# Patient Record
Sex: Female | Born: 1955 | Race: White | Hispanic: No | Smoking: Never smoker
Health system: Southern US, Community
[De-identification: ages and names within clinical notes are randomized; demographics above are authoritative.]

## PROBLEM LIST (undated history)

## (undated) DIAGNOSIS — I639 Cerebral infarction, unspecified: Secondary | ICD-10-CM

## (undated) DIAGNOSIS — F32A Depression, unspecified: Secondary | ICD-10-CM

## (undated) DIAGNOSIS — R4189 Other symptoms and signs involving cognitive functions and awareness: Secondary | ICD-10-CM

## (undated) DIAGNOSIS — F329 Major depressive disorder, single episode, unspecified: Secondary | ICD-10-CM

## (undated) DIAGNOSIS — I10 Essential (primary) hypertension: Secondary | ICD-10-CM

## (undated) DIAGNOSIS — R413 Other amnesia: Secondary | ICD-10-CM

## (undated) HISTORY — DX: Cerebral infarction, unspecified: I63.9

## (undated) HISTORY — DX: Essential (primary) hypertension: I10

## (undated) HISTORY — DX: Other amnesia: R41.3

## (undated) HISTORY — DX: Major depressive disorder, single episode, unspecified: F32.9

## (undated) HISTORY — DX: Depression, unspecified: F32.A

## (undated) HISTORY — PX: BREAST EXCISIONAL BIOPSY: SUR124

## (undated) HISTORY — DX: Other symptoms and signs involving cognitive functions and awareness: R41.89

---

## 2004-08-31 ENCOUNTER — Ambulatory Visit: Payer: Self-pay | Admitting: Internal Medicine

## 2004-12-06 ENCOUNTER — Ambulatory Visit: Payer: Self-pay | Admitting: Internal Medicine

## 2005-06-06 ENCOUNTER — Ambulatory Visit: Payer: Self-pay | Admitting: Specialist

## 2005-07-28 ENCOUNTER — Ambulatory Visit: Payer: Self-pay | Admitting: General Surgery

## 2006-08-17 ENCOUNTER — Ambulatory Visit: Payer: Self-pay | Admitting: General Surgery

## 2007-02-05 ENCOUNTER — Emergency Department: Payer: Self-pay | Admitting: Emergency Medicine

## 2007-02-05 ENCOUNTER — Other Ambulatory Visit: Payer: Self-pay

## 2007-05-01 ENCOUNTER — Emergency Department: Payer: Self-pay | Admitting: Emergency Medicine

## 2007-07-04 ENCOUNTER — Ambulatory Visit: Payer: Self-pay | Admitting: Obstetrics and Gynecology

## 2007-08-04 ENCOUNTER — Ambulatory Visit: Payer: Self-pay | Admitting: Gastroenterology

## 2007-08-29 ENCOUNTER — Ambulatory Visit: Payer: Self-pay | Admitting: Gastroenterology

## 2007-08-29 ENCOUNTER — Ambulatory Visit: Payer: Self-pay | Admitting: Unknown Physician Specialty

## 2007-09-06 ENCOUNTER — Ambulatory Visit: Payer: Self-pay | Admitting: General Surgery

## 2011-05-20 ENCOUNTER — Ambulatory Visit: Payer: Self-pay | Admitting: Family Medicine

## 2011-06-17 ENCOUNTER — Inpatient Hospital Stay: Payer: Self-pay | Admitting: Student

## 2011-06-19 DIAGNOSIS — I6789 Other cerebrovascular disease: Secondary | ICD-10-CM

## 2015-05-13 ENCOUNTER — Ambulatory Visit: Payer: PRIVATE HEALTH INSURANCE | Attending: Neurology | Admitting: Speech Pathology

## 2015-05-13 DIAGNOSIS — R41841 Cognitive communication deficit: Secondary | ICD-10-CM | POA: Diagnosis not present

## 2015-05-13 DIAGNOSIS — R413 Other amnesia: Secondary | ICD-10-CM | POA: Insufficient documentation

## 2015-05-14 ENCOUNTER — Encounter: Payer: Self-pay | Admitting: Speech Pathology

## 2015-05-14 NOTE — Therapy (Signed)
[place] [facility] MAIN [facility] SERVICES [address] [place], [place], [address] Phone: [phone]   Fax:  [phone]  Speech Language Pathology Evaluation  Patient Details  Name: Victoria Myers MRN: [id number] Date of Birth: [birth year] Referring Provider:  Morene Crocker, MD  Encounter Date: 05/13/2015      End of Session - 05/14/15 1151    Visit Number 1   Number of Visits 9   Date for SLP Re-Evaluation 06/17/15   SLP Start Time 0900   SLP Stop Time  0957   SLP Time Calculation (min) 57 min   Activity Tolerance Patient tolerated treatment well      Past Medical History  Diagnosis Date  . Stroke   . Depression   . Memory loss     Per MD note  . Hypertension   . Pseudodementia     Per MD note    History reviewed. No pertinent past surgical history.  There were no vitals filed for this visit.  Visit Diagnosis: Cognitive communication deficit - Plan: SLP plan of care cert/re-cert      Subjective Assessment - 05/14/15 1143    Subjective The patient and her daughter describe short term memory loss.  The patient states that she is able to keep up with her work responsibilities by using a system.  Her daughter reports that the patient is "forgetfull and tells the same story over and over"   Currently in Pain? No/denies            SLP Evaluation [facility] - 05/14/15 1143    SLP Visit Information   SLP Received On 05/13/15   Onset Date 04/27/2015   Medical Diagnosis Memory loss   Subjective   Patient/Family Stated Goal Maintain independence   Prior Functional Status   Cognitive/Linguistic Baseline Baseline deficits   Baseline deficit details memory impairments, pseudodementia   Cognition   Overall Cognitive Status History of cognitive impairments - at baseline   Standardized Assessments   Standardized Assessments  Montreal Cognitive Assessment (MOCA);Cognitive Linguistic Quick Test      The Cognitive Linguistic Quick Test  (CLQT) was administered to assess the relative status of five cognitive domains: attention, memory, language, executive functioning, and visuospatial skills. Scores from 10 tasks were used to estimate severity ratings (for age groups 18-69 years and 70-89 years) for each domain, a clock drawing task, as well as an overall composite severity rating of cognition.    Task    Score Personal Facts  8/8 Symbol Cancellation  12/12 Confrontation Naming  10/10 Clock Drawing  11/13 Story Retelling   4/10 Symbol Trails    9/10 Generative Naming  8/9 Design Memory  4/6 Mazes    4/8 Design Generation   8/13  Cognitive Domain  Severity Rating Attention   Mild  Memory   Moderate Executive Function  WNL Language   WNL Visuospatial Skills  Mild Clock Drawing  Mild  Composite Severity Rating Mild     Montreal Cognitive Assessment (MOCA)  Version: 7.2  Visuospatieal/Executive Alternating trail making       1/1 Visuoconstruction Skills (copy 3-d design) 1/1 Draw a clock     1/3 Naming     3/3 Attention Forward digit span    1/1 Backward digit span    1/1 Vigilance     1/1 Serial 7's     3/3 Language  Verbal Fluency    1/1 Repetition     1/2 Abstraction     1/2  Delayed Recall    0/5 Orientation     3/6  TOTAL     18/30            SLP Education - 05/14/15 1150    Education provided Yes   Education Details Discuss the nature of speech therapy for memory loss- approach will be restorative and compensatory treatment   Person(s) Educated Patient;Child(ren)   Methods Explanation   Comprehension Verbalized understanding            SLP Long Term Goals - 05/14/15 1216    SLP LONG TERM GOAL #1   Title Patient will demonstrate functional cognitive-communication skills for independent completion of personal responsibilities.   Time 4   Period Weeks   Status New   SLP LONG TERM GOAL #2   Title Patient will complete memory strategy activities with 80% accuracy.   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  The results of the Cognitive Linguistic Quick Test (CLQT) indicate a composite severity rating of mild.  The patient demonstrates moderate deficits in the domain of memory, mild deficits in the domains of attention, visuospatial skills, and clock drawing.  The patient demonstrated WNL function in the domains of language and executive skills.  The patient demonstrates relative strength in language function and executive skills.     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  She does not have or implement a similar system for her personal life.  The patient would benefit from skilled speech therapy for restorative and compensatory treatment of memory and cognitive-communication deficits.  In view of her successful use of an external memory system with her work, it is anticipated that she can achieve equal success with her person life.   Speech Therapy Frequency 2x / week   Duration 4 weeks   Treatment/Interventions Internal/external aids;Cognitive reorganization;Functional tasks;SLP instruction and feedback;Compensatory strategies;Patient/family education   Potential to Achieve Goals Good   Potential Considerations Ability to learn/carryover information;Co-morbidities;Cooperation/participation level;Previous level of function;Severity of impairments;Family/community support   SLP Home Exercise Plan To be developed   Consulted and Agree with Plan of Care Patient;Family member/caregiver   Family Member Consulted Daughter        Problem List There are no active problems to display for this patient.   Leandrew Koyanagi 05/14/2015, 12:23 PM  [place] [facility] MAIN [facility] SERVICES [address] [place], [place], [address] Phone: [phone]   Fax:  [phone]

## 2015-05-19 ENCOUNTER — Encounter: Payer: PRIVATE HEALTH INSURANCE | Admitting: Speech Pathology

## 2015-05-21 ENCOUNTER — Ambulatory Visit: Payer: PRIVATE HEALTH INSURANCE | Attending: Neurology | Admitting: Speech Pathology

## 2015-05-21 DIAGNOSIS — R41841 Cognitive communication deficit: Secondary | ICD-10-CM | POA: Diagnosis not present

## 2015-05-22 ENCOUNTER — Encounter: Payer: Self-pay | Admitting: Speech Pathology

## 2015-05-22 NOTE — Therapy (Signed)
[facility] MAIN [facility] SERVICES [address] [place], [place], [address] Phone: [phone]   Fax:  [phone]  Speech Language Pathology Treatment  Patient Details  Name: Victoria Myers MRN: [id number] Date of Birth: 1956/09/26 Referring Provider:  Morene Crocker, MD  Encounter Date: 05/21/2015      End of Session - 05/22/15 1407    Visit Number 2   Number of Visits 9   Date for SLP Re-Evaluation 06/17/15   SLP Start Time 0900   SLP Stop Time  0948   SLP Time Calculation (min) 48 min   Activity Tolerance Patient tolerated treatment well      Past Medical History  Diagnosis Date  . Stroke   . Depression   . Memory loss     Per MD note  . Hypertension   . Pseudodementia     Per MD note    History reviewed. No pertinent past surgical history.  There were no vitals filed for this visit.  Visit Diagnosis: Cognitive communication deficit      Subjective Assessment - 05/22/15 1406    Subjective Patient states that she feels more comfortable than she did at the initial evaluation.   Patient is accompained by: Family member   Currently in Pain? No/denies               ADULT SLP TREATMENT - 05/22/15 0001    General Information   Behavior/Cognition Alert;Cooperative;Pleasant mood;Requires cueing   Treatment Provided   Treatment provided Cognitive-Linquistic   Pain Assessment   Pain Assessment No/denies pain   Cognitive-Linquistic Treatment   Treatment focused on Cognition   Skilled Treatment The patient is accompanied by her daughter, who is providing the cognitive assistance that the patient requires.  The patient brought her work Visual merchandiser.  This is working quite well per patient "I never forget anything for my clients!"  The three of [place] discussed implemented a second system to cover everything except work.  We reviewed/discussed a sample "memory book" system.  The patient's daughter generated a plan  for the type of system to purchase and will help the patient get the system organized. The patient was given written memory strategies.  We discussed the need for the patient to begin to schedule activities that will get her re-engaged in her life.   Assessment / Recommendations / Plan   Plan Continue with current plan of care   Progression Toward Goals   Progression toward goals Progressing toward goals          SLP Education - 05/22/15 1407    Education provided Yes   Education Details Memor strategies, effective use of external memory aids   Person(s) Educated Patient;Child(ren)   Methods Explanation;Handout   Comprehension Verbalized understanding            SLP Long Term Goals - 05/14/15 1216    SLP LONG TERM GOAL #1   Title Patient will demonstrate functional cognitive-communication skills for independent completion of personal responsibilities.   Time 4   Period Weeks   Status New   SLP LONG TERM GOAL #2   Title Patient will complete memory strategy activities with 80% accuracy.   Time 4   Period Weeks   Status New          Plan - 05/22/15 1408    Clinical Impression Statement The patient demonstrates good potential to effectively use an organizational system that will encourage her to be independent in meeting  her personal responsibilities as well as re-engaging in her life.   Speech Therapy Frequency 2x / week   Duration 4 weeks   Treatment/Interventions Internal/external aids;Cognitive reorganization;Functional tasks;SLP instruction and feedback;Compensatory strategies;Patient/family education   Potential to Achieve Goals Good   Potential Considerations Ability to learn/carryover information;Co-morbidities;Cooperation/participation level;Previous level of function;Severity of impairments;Family/community support   SLP Home Exercise Plan The patient and her daughter will purchase system and begin implementation   Consulted and Agree with Plan of Care  Patient;Family member/caregiver   Family Member Consulted Daughter        Problem List There are no active problems to display for this patient.  Dollene Primrose, MS/CCC- SLP  Leandrew Koyanagi 05/22/2015, 2:10 PM  [place] [facility] MAIN [facility] SERVICES [address] [place], [place], [address] Phone: [phone]   Fax:  [phone]

## 2015-05-26 ENCOUNTER — Ambulatory Visit: Payer: PRIVATE HEALTH INSURANCE | Admitting: Speech Pathology

## 2015-05-26 DIAGNOSIS — R41841 Cognitive communication deficit: Secondary | ICD-10-CM

## 2015-05-27 ENCOUNTER — Encounter: Payer: Self-pay | Admitting: Speech Pathology

## 2015-05-27 NOTE — Therapy (Signed)
[place] [facility] MAIN [facility] SERVICES [address] [place], [place], [address] Phone: [phone]   Fax:  [phone]  Speech Language Pathology Treatment  Patient Details  Name: Victoria Myers MRN: [id number] Date of Birth: Apr 09, 1956 Referring Provider:  Morene Crocker, MD  Encounter Date: 05/26/2015      End of Session - 05/27/15 0937    Visit Number 3   Number of Visits 9   Date for SLP Re-Evaluation 06/17/15   SLP Start Time 1430   SLP Stop Time  1500   SLP Time Calculation (min) 30 min   Activity Tolerance Patient tolerated treatment well      Past Medical History  Diagnosis Date  . Stroke   . Depression   . Memory loss     Per MD note  . Hypertension   . Pseudodementia     Per MD note    History reviewed. No pertinent past surgical history.  There were no vitals filed for this visit.  Visit Diagnosis: Cognitive communication deficit      Subjective Assessment - 05/27/15 0936    Subjective Patient reports that she is feeling very positive about using the organization system to improve her independence and help her re-engage in her life.   Patient is accompained by: Family member   Currently in Pain? No/denies          ADULT SLP TREATMENT - 05/27/15 0001    General Information   Behavior/Cognition Alert;Cooperative;Pleasant mood;Requires cueing   Treatment Provided   Treatment provided Cognitive-Linquistic   Pain Assessment   Pain Assessment No/denies pain   Cognitive-Linquistic Treatment   Treatment focused on Cognition   Skilled Treatment The patient is accompanied by her daughter, who is providing the cognitive assistance that the patient requires.  The patient bought a new work Visual merchandiser.  Her daughter is helping her get the system set up and develop the protocols to keep it going.  The system has a "note" section on the front and monthly and weekly calendars.  The patient was guided in using  the note section to write herself reminders to make calls and schedule items.    Assessment / Recommendations / Plan   Plan Continue with current plan of care   Progression Toward Goals   Progression toward goals Progressing toward goals          SLP Education - 05/27/15 0937    Education provided Yes   Education Details Effective use of external memory aids   Person(s) Educated Patient;Child(ren)   Methods Explanation   Comprehension Verbalized understanding;Returned demonstration;Need further instruction           SLP Long Term Goals - 05/14/15 1216    SLP LONG TERM GOAL #1   Title Patient will demonstrate functional cognitive-communication skills for independent completion of personal responsibilities.   Time 4   Period Weeks   Status New   SLP LONG TERM GOAL #2   Title Patient will complete memory strategy activities with 80% accuracy.   Time 4   Period Weeks   Status New          Plan - 05/27/15 2725    Clinical Impression Statement The patient demonstrates good potential to effectively use an organizational system that will encourage her to be independent in meeting her personal responsibilities as well as re-engaging in her life.   Speech Therapy Frequency 2x / week   Duration 4 weeks   Treatment/Interventions Internal/external aids;Cognitive  reorganization;Functional tasks;SLP instruction and feedback;Compensatory strategies;Patient/family education   Potential to Achieve Goals Good   Potential Considerations Ability to learn/carryover information;Co-morbidities;Cooperation/participation level;Previous level of function;Severity of impairments;Family/community support   SLP Home Exercise Plan The patient and her daughter are to continue implementing organization system   Consulted and Agree with Plan of Care Patient;Family member/caregiver   Family Member Consulted Daughter        Problem List There are no active problems to display for this  patient.  Dollene Primrose, MS/CCC- SLP  Leandrew Koyanagi 05/27/2015, 9:40 AM  [place] [facility] MAIN [facility] SERVICES [address] [place], [place], [address] Phone: [phone]   Fax:  [phone]

## 2015-05-28 ENCOUNTER — Ambulatory Visit: Payer: PRIVATE HEALTH INSURANCE | Admitting: Speech Pathology

## 2015-06-02 ENCOUNTER — Ambulatory Visit: Payer: PRIVATE HEALTH INSURANCE | Admitting: Speech Pathology

## 2015-06-02 DIAGNOSIS — R41841 Cognitive communication deficit: Secondary | ICD-10-CM | POA: Diagnosis not present

## 2015-06-03 ENCOUNTER — Encounter: Payer: Self-pay | Admitting: Speech Pathology

## 2015-06-03 NOTE — Therapy (Signed)
[place] [facility] MAIN [facility] SERVICES [address] [place], [place], [address] Phone: [phone]   Fax:  [phone]  Speech Language Pathology Treatment  Patient Details  Name: Victoria Myers MRN: [id number] Date of Birth: April 17, 1956 Referring Provider:  Morene Crocker, MD  Encounter Date: 06/02/2015      End of Session - 06/03/15 1405    Visit Number 4   Number of Visits 9   Date for SLP Re-Evaluation 06/17/15   SLP Start Time 0900   SLP Stop Time  0947   SLP Time Calculation (min) 47 min   Activity Tolerance Patient tolerated treatment well      Past Medical History  Diagnosis Date  . Stroke   . Depression   . Memory loss     Per MD note  . Hypertension   . Pseudodementia     Per MD note    History reviewed. No pertinent past surgical history.  There were no vitals filed for this visit.  Visit Diagnosis: Cognitive communication deficit      Subjective Assessment - 06/03/15 1403    Subjective "I feel good"  Patient reports that she is feeling perkier.   Currently in Pain? No/denies               ADULT SLP TREATMENT - 06/03/15 0001    General Information   Behavior/Cognition Alert;Cooperative;Pleasant mood;Requires cueing   Treatment Provided   Treatment provided Cognitive-Linquistic   Pain Assessment   Pain Assessment No/denies pain   Cognitive-Linquistic Treatment   Treatment focused on Cognition   Skilled Treatment The patient came to therapy without her daughter, who is providing the cognitive assistance that the patient.  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  The patient reports that she is using the new system.  The patient was guided in using the note section to write herself reminder to find her speech therapy schedule and write her schedule in her system.  She was guided in writing in her next speech therapy appointment.  The patient reports that she discovered that she had not  re-filled a medication (Aricept) and so had not been taking it "for a while".  She feels that resuming this medication has been efficacious and has led to her returning to many activities including going to church, going out to eat with a friend, cooking, doing things around the house and yard, and visiting with her aunt.    Assessment / Recommendations / Plan   Plan Continue with current plan of care   Progression Toward Goals   Progression toward goals Progressing toward goals          SLP Education - 06/03/15 1404    Education provided Yes   Education Details Effective use of external memory aids   Person(s) Educated Patient   Methods Explanation;Demonstration   Comprehension Verbalized understanding;Returned demonstration;Need further instruction            SLP Long Term Goals - 05/14/15 1216    SLP LONG TERM GOAL #1   Title Patient will demonstrate functional cognitive-communication skills for independent completion of personal responsibilities.   Time 4   Period Weeks   Status New   SLP LONG TERM GOAL #2   Title Patient will complete memory strategy activities with 80% accuracy.   Time 4   Period Weeks   Status New          Plan - 06/03/15 1405    Clinical Impression  Statement The patient demonstrates good potential to effectively use an organizational system that will encourage her to be independent in meeting her personal responsibilities as well as re-engaging in her life.  She has resumed taking Aricept, which appears to have a meliorating effect on her disengaging from life activities.   Speech Therapy Frequency 2x / week   Duration 4 weeks   Treatment/Interventions Internal/external aids;Cognitive reorganization;Functional tasks;SLP instruction and feedback;Compensatory strategies;Patient/family education   Potential to Achieve Goals Good   Potential Considerations Ability to learn/carryover information;Co-morbidities;Cooperation/participation level;Previous  level of function;Severity of impairments;Family/community support   SLP Home Exercise Plan The patient is to continue implementing organization system   Consulted and Agree with Plan of Care Patient        Problem List There are no active problems to display for this patient.  Dollene Primrose, MS/CCC- SLP  Leandrew Koyanagi 06/03/2015, 2:07 PM  [place] [facility] MAIN [facility] SERVICES [address] [place], [place], [address] Phone: [phone]   Fax:  [phone]

## 2015-06-04 ENCOUNTER — Ambulatory Visit: Payer: PRIVATE HEALTH INSURANCE | Admitting: Speech Pathology

## 2015-06-04 DIAGNOSIS — R41841 Cognitive communication deficit: Secondary | ICD-10-CM

## 2015-06-05 ENCOUNTER — Encounter: Payer: Self-pay | Admitting: Speech Pathology

## 2015-06-05 NOTE — Therapy (Signed)
[place] [facility] MAIN [facility] SERVICES [address] [place], [place], [address] Phone: [phone]   Fax:  [phone]  Speech Language Pathology Treatment  Patient Details  Name: Victoria Myers MRN: [id number] Date of Birth: 20-Sep-1956 Referring Provider:  Morene Crocker, MD  Encounter Date: 06/04/2015      End of Session - 06/05/15 0921    Visit Number 5   Number of Visits 9   Date for SLP Re-Evaluation 06/17/15   SLP Start Time 1300   SLP Stop Time  1352   SLP Time Calculation (min) 52 min   Activity Tolerance Patient tolerated treatment well      Past Medical History  Diagnosis Date  . Stroke   . Depression   . Memory loss     Per MD note  . Hypertension   . Pseudodementia     Per MD note    History reviewed. No pertinent past surgical history.  There were no vitals filed for this visit.  Visit Diagnosis: Cognitive communication deficit      Subjective Assessment - 06/05/15 0920    Subjective "I feel good"     Patient is accompained by: Family member   Currently in Pain? No/denies               ADULT SLP TREATMENT - 06/05/15 0001    General Information   Behavior/Cognition Alert;Cooperative;Pleasant mood;Requires cueing   Treatment Provided   Treatment provided Cognitive-Linquistic   Pain Assessment   Pain Assessment No/denies pain   Cognitive-Linquistic Treatment   Treatment focused on Cognition   Skilled Treatment The patient came to therapy with her daughter, who is providing the cognitive assistance that the patient requires.  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  The patient reports that she is using the new system, however there is minimal evidence that she is effectively using the system yet.  Last session, the patient was guided in using the note section to write herself reminder to find her speech therapy schedule and write her schedule in her system.  This had not done been  done.  She required mod cues to effectively write the remainder of her scheduled speech therapy sessions in her calendar.  The patient reports at least 2 episodes of bursts of anger (per daughter, her anger response is excessive and leads to poor judgement).  The patient was guided to identify actions she can take to de-escalate these bursts of anger so that she can make good decisions.    She identifies playing with her dog as a strategy.  She is advised to do this when she feels herself becoming anger either when something annoying occurs or when she recalls annoying events.   Assessment / Recommendations / Plan   Plan Continue with current plan of care   Progression Toward Goals   Progression toward goals Progressing toward goals          SLP Education - 06/05/15 0920    Education Details Use of external memory aids, strategies to de-escalate anger   Person(s) Educated Patient;Child(ren)   Methods Explanation;Demonstration;Verbal cues   Comprehension Verbalized understanding;Returned demonstration;Need further instruction            SLP Long Term Goals - 05/14/15 1216    SLP LONG TERM GOAL #1   Title Patient will demonstrate functional cognitive-communication skills for independent completion of personal responsibilities.   Time 4   Period Weeks   Status New   SLP  LONG TERM GOAL #2   Title Patient will complete memory strategy activities with 80% accuracy.   Time 4   Period Weeks   Status New          Plan - 06/05/15 1610    Clinical Impression Statement The patient demonstrates good potential to effectively use an organizational system that will encourage her to be independent in meeting her personal responsibilities as well as re-engaging in her life.  The patient and her daughter have identified excessive bursts of anger that can cloud her judgment.   Speech Therapy Frequency 2x / week   Duration 4 weeks   Treatment/Interventions Internal/external aids;Cognitive  reorganization;Functional tasks;SLP instruction and feedback;Compensatory strategies;Patient/family education   Potential to Achieve Goals Good   Potential Considerations Ability to learn/carryover information;Co-morbidities;Cooperation/participation level;Previous level of function;Severity of impairments;Family/community support   SLP Home Exercise Plan The patient is to continue implementing organization system and practice controlling anger bursts   Consulted and Agree with Plan of Care Patient;Family member/caregiver   Family Member Consulted Daughter        Problem List There are no active problems to display for this patient.  Dollene Primrose, MS/CCC- SLP  Leandrew Koyanagi 06/05/2015, 9:23 AM  [facility] MAIN [facility] SERVICES [address] [place], [place], [address] Phone: [phone]   Fax:  [phone]

## 2015-06-09 ENCOUNTER — Encounter: Payer: Self-pay | Admitting: Speech Pathology

## 2015-06-09 ENCOUNTER — Ambulatory Visit: Payer: PRIVATE HEALTH INSURANCE | Admitting: Speech Pathology

## 2015-06-09 DIAGNOSIS — R41841 Cognitive communication deficit: Secondary | ICD-10-CM | POA: Diagnosis not present

## 2015-06-09 NOTE — Therapy (Signed)
[facility] MAIN [facility] SERVICES [address] [place], [place], [address] Phone: [phone]   Fax:  [phone]  Speech Language Pathology Treatment  Patient Details  Name: Victoria Myers MRN: [id number] Date of Birth: [birth year] Referring Provider:  Morene Crocker, MD  Encounter Date: 06/09/2015      End of Session - 06/09/15 1636    Visit Number 6   Number of Visits 9   Date for SLP Re-Evaluation 06/17/15   SLP Start Time 1300   SLP Stop Time  1346   SLP Time Calculation (min) 46 min   Activity Tolerance Patient tolerated treatment well      Past Medical History  Diagnosis Date  . Stroke   . Depression   . Memory loss     Per MD note  . Hypertension   . Pseudodementia     Per MD note    History reviewed. No pertinent past surgical history.  There were no vitals filed for this visit.  Visit Diagnosis: Cognitive communication deficit      Subjective Assessment - 06/09/15 1635    Subjective "I feel good"     Currently in Pain? No/denies               ADULT SLP TREATMENT - 06/09/15 0001    General Information   Behavior/Cognition Alert;Cooperative;Pleasant mood;Requires cueing   Treatment Provided   Treatment provided Cognitive-Linquistic   Pain Assessment   Pain Assessment No/denies pain   Cognitive-Linquistic Treatment   Treatment focused on Cognition   Skilled Treatment The patient came to therapy without her daughter, who is providing the cognitive assistance that the patient requires.  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  The patient reports that she is using the new system, however there is minimal evidence that she is effectively using the system yet.  Last session, the patient was guided in using the note section to write herself a reminder to check off 2 reminder notes once completed.  This had not done been done.  Last session, the patient reported bursts of anger (per  daughter, her anger response is excessive and leads to poor judgement).  The patient was guided to identify actions she can take to de-escalate these bursts of anger so that she can make good decisions.    She identifies playing with her dog as a strategy.  The patient reports that she is doing this "sometimes".  The patient was guided in writing reminders to schedule 2 personal activities in her weekly schedule.  She reports that she did not go to mass on Saturday as she had planned.  After questioning, it became evident that she is reluctant and embarrassed to go back after a long absence.  She has scheduled mass for Saturday and agrees to go despite feeling embarrassed.      Assessment / Recommendations / Plan   Plan Continue with current plan of care   Progression Toward Goals   Progression toward goals Progressing toward goals          SLP Education - 06/09/15 1635    Education provided Yes   Education Details Use of external memory aids, strategies to de-escalate anger   Person(s) Educated Patient   Methods Explanation;Demonstration;Verbal cues   Comprehension Verbalized understanding;Returned demonstration;Need further instruction            SLP Long Term Goals - 05/14/15 1216    SLP LONG TERM GOAL #1   Title Patient  will demonstrate functional cognitive-communication skills for independent completion of personal responsibilities.   Time 4   Period Weeks   Status New   SLP LONG TERM GOAL #2   Title Patient will complete memory strategy activities with 80% accuracy.   Time 4   Period Weeks   Status New          Plan - 06/09/15 1636    Clinical Impression Statement The patient demonstrates good potential to effectively use an organizational system that will encourage her to be independent in meeting her personal responsibilities as well as re-engaging in her life.  She is requiring assistance in learning to implement.  As we explore why she has not completed agreed upon  activities, it becomes evident that she is reluctant to face potential judgment by others.   Speech Therapy Frequency 2x / week   Duration 4 weeks   Treatment/Interventions Internal/external aids;Cognitive reorganization;Functional tasks;SLP instruction and feedback;Compensatory strategies;Patient/family education   Potential to Achieve Goals Good   Potential Considerations Ability to learn/carryover information;Co-morbidities;Cooperation/participation level;Previous level of function;Severity of impairments;Family/community support   SLP Home Exercise Plan The patient is to continue implementing organization system and practice controlling anger bursts   Consulted and Agree with Plan of Care Patient        Problem List There are no active problems to display for this patient.  Dollene Primrose, MS/CCC- SLP  Leandrew Koyanagi 06/09/2015, 4:37 PM  [facility] MAIN [facility] SERVICES [address] [place], [place], [address] Phone: [phone]   Fax:  [phone]

## 2015-06-11 ENCOUNTER — Ambulatory Visit: Payer: PRIVATE HEALTH INSURANCE | Admitting: Speech Pathology

## 2015-06-11 ENCOUNTER — Encounter: Payer: Self-pay | Admitting: Speech Pathology

## 2015-06-11 DIAGNOSIS — R41841 Cognitive communication deficit: Secondary | ICD-10-CM | POA: Diagnosis not present

## 2015-06-11 NOTE — Therapy (Signed)
[facility] MAIN [facility] SERVICES [address] [place], [place], [address] Phone: [phone]   Fax:  [phone]  Speech Language Pathology Treatment  Patient Details  Name: Victoria Myers MRN: [id number] Date of Birth: 03/06/1956 Referring Provider:  Morene Crocker, MD  Encounter Date: 06/11/2015      End of Session - 06/11/15 1345    Visit Number 7   Number of Visits 9   Date for SLP Re-Evaluation 06/17/15   SLP Start Time 1230   SLP Stop Time  1330   SLP Time Calculation (min) 60 min   Activity Tolerance Patient tolerated treatment well      Past Medical History  Diagnosis Date  . Stroke   . Depression   . Memory loss     Per MD note  . Hypertension   . Pseudodementia     Per MD note    History reviewed. No pertinent past surgical history.  There were no vitals filed for this visit.  Visit Diagnosis: Cognitive communication deficit      Subjective Assessment - 06/11/15 1344    Subjective "I feel good"     Patient is accompained by: Family member   Currently in Pain? No/denies               ADULT SLP TREATMENT - 06/11/15 0001    General Information   Behavior/Cognition Alert;Cooperative;Pleasant mood;Requires cueing   Treatment Provided   Treatment provided Cognitive-Linquistic   Pain Assessment   Pain Assessment No/denies pain   Cognitive-Linquistic Treatment   Treatment focused on Cognition   Skilled Treatment The patient came to therapy with her daughter, who is providing the cognitive assistance that the patient requires.  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  The patient reports that she is using the new system and there is greater evidence that she is effectively using the system yet.  Last session, the patient was guided in writing reminders to schedule 2 personal activities in her weekly schedule.  Last session, she reported that she did not go to mass on Saturday as she had  planned.  After questioning, it became evident that she is reluctant and embarrassed to go back after a long absence.  The patient completed 1 of the 2 items to be scheduled.  She is to complete the uncompleted assignment and has a new assignment.  The patient was "quizzed" on finding information in her calendar found this irritating.  Upon discussion, it became apparent that she was reluctant to use the tools available (ie., thought she was to know the information rather than find it).  We discussed a strategy of reflection on why she is irritated by someone/something as a way to find solutions.   Assessment / Recommendations / Plan   Plan Continue with current plan of care   Progression Toward Goals   Progression toward goals Progressing toward goals          SLP Education - 06/11/15 1344    Education provided Yes   Education Details Use of external memory aids, strategies to de-escalate anger/frustration   Person(s) Educated Patient;Child(ren)   Methods Explanation;Demonstration;Verbal cues   Comprehension Verbalized understanding;Returned demonstration;Need further instruction            SLP Long Term Goals - 05/14/15 1216    SLP LONG TERM GOAL #1   Title Patient will demonstrate functional cognitive-communication skills for independent completion of personal responsibilities.   Time 4   Period  Weeks   Status New   SLP LONG TERM GOAL #2   Title Patient will complete memory strategy activities with 80% accuracy.   Time 4   Period Weeks   Status New          Plan - 06/11/15 1345    Clinical Impression Statement The patient demonstrates good potential to effectively use an organizational system that will encourage her to be independent in meeting her personal responsibilities as well as re-engaging in her life.  She is requiring assistance in learning to implement.  As we explore why she has not completed agreed upon activities, it becomes evident that she is reluctant to  face potential judgment by others.  As we explore why she becomes irritated by questions, it becomes evident that she thinks she is required to know without consulting her external memory strategies.    Speech Therapy Frequency 2x / week   Duration 4 weeks   Treatment/Interventions Internal/external aids;Cognitive reorganization;Functional tasks;SLP instruction and feedback;Compensatory strategies;Patient/family education   Potential to Achieve Goals Good   Potential Considerations Ability to learn/carryover information;Co-morbidities;Cooperation/participation level;Previous level of function;Severity of impairments;Family/community support   SLP Home Exercise Plan The patient is to continue implementing organization system, practice controlling anger bursts, and reflect on triggers fro anger and frustration   Consulted and Agree with Plan of Care Patient;Family member/caregiver   Family Member Consulted Daughter        Problem List There are no active problems to display for this patient.  Dollene Primrose, MS/CCC- SLP  Leandrew Koyanagi 06/11/2015, 1:47 PM  [place] [facility] MAIN [facility] SERVICES [address] [place], [place], [address] Phone: [phone]   Fax:  [phone]

## 2015-06-16 ENCOUNTER — Encounter: Payer: Self-pay | Admitting: Speech Pathology

## 2015-06-16 ENCOUNTER — Ambulatory Visit: Payer: PRIVATE HEALTH INSURANCE | Admitting: Speech Pathology

## 2015-06-16 DIAGNOSIS — R41841 Cognitive communication deficit: Secondary | ICD-10-CM

## 2015-06-16 NOTE — Therapy (Signed)
[place] [facility] MAIN [facility] SERVICES [address] [place], [place], [address] Phone: [phone]   Fax:  [phone]  Speech Language Pathology Treatment  Patient Details  Name: Victoria Myers MRN: [id number] Date of Birth: 1956/02/20 Referring Provider:  Morene Crocker, MD  Encounter Date: 06/16/2015      End of Session - 06/16/15 1357    Visit Number 8   Number of Visits 9   Date for SLP Re-Evaluation 06/17/15   SLP Start Time 1300   SLP Stop Time  1358   SLP Time Calculation (min) 58 min   Activity Tolerance Patient tolerated treatment well      Past Medical History  Diagnosis Date  . Stroke   . Depression   . Memory loss     Per MD note  . Hypertension   . Pseudodementia     Per MD note    History reviewed. No pertinent past surgical history.  There were no vitals filed for this visit.  Visit Diagnosis: Cognitive communication deficit      Subjective Assessment - 06/16/15 1356    Subjective The patient reports that since her last session she has had to work overtime and a very close friend died.     Patient is accompained by: Family member   Currently in Pain? No/denies               ADULT SLP TREATMENT - 06/16/15 0001    General Information   Behavior/Cognition Alert;Cooperative;Pleasant mood;Requires cueing   Treatment Provided   Treatment provided Cognitive-Linquistic   Pain Assessment   Pain Assessment No/denies pain   Cognitive-Linquistic Treatment   Treatment focused on Cognition   Skilled Treatment The patient came to therapy with her daughter, who is providing the cognitive assistance that the patient requires.  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  The patient reports that she is using the new system and there is greater evidence that she is effectively using the system yet.  Last session, the patient was guided in writing reminders to schedule 2 personal activities in her  weekly schedule.  The patient completed 1 of the 2 items to be scheduled.  The patient reports that since her last session she has had to work overtime and a very close friend died.  Despite this, she completed the assignment.  The patient was "quizzed" on finding information in her calendar found this irritating.  Upon discussion, it became apparent that she was reluctant to use the tools available (ie., thought she was to know the information rather than find it).  We discussed a strategy of reflection on why she is irritated by someone/something as a way to find solutions.  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   Assessment / Recommendations / Plan   Plan Continue with current plan of care   Progression Toward Goals   Progression toward goals Progressing toward goals          SLP Education - 06/16/15 1357    Education provided Yes   Education Details Use of external memory aids, strategies to de-escalate anger/frustration   Person(s) Educated Patient;Child(ren)   Methods Explanation;Demonstration;Verbal cues   Comprehension Verbalized understanding;Returned demonstration;Verbal cues required;Need further instruction            SLP Long Term Goals - 05/14/15 1216    SLP LONG TERM GOAL #1   Title Patient will demonstrate functional cognitive-communication  skills for independent completion of personal responsibilities.   Time 4   Period Weeks   Status New   SLP LONG TERM GOAL #2   Title Patient will complete memory strategy activities with 80% accuracy.   Time 4   Period Weeks   Status New          Plan - 06/16/15 1358    Clinical Impression Statement The patient demonstrates good potential to effectively use an organizational system that will encourage her to be independent in meeting her personal responsibilities as well as re-engaging in her life.  She is requiring assistance in learning to implement.  As we  explore why she has not completed agreed upon activities, it becomes evident that she is reluctant to face potential judgment by others.  As we explore why she becomes irritated by questions, it becomes evident that she thinks she is required to know without consulting her external memory strategies.    Speech Therapy Frequency 2x / week   Duration 4 weeks   Treatment/Interventions Internal/external aids;Cognitive reorganization;Functional tasks;SLP instruction and feedback;Compensatory strategies;Patient/family education   Potential to Achieve Goals Good   Potential Considerations Ability to learn/carryover information;Co-morbidities;Cooperation/participation level;Previous level of function;Severity of impairments;Family/community support   SLP Home Exercise Plan The patient is to [employer], practice controlling anger bursts, and reflect on triggers fro anger and frustration   Consulted and Agree with Plan of Care Patient;Family member/caregiver   Family Member Consulted Daughter        Problem List There are no active problems to display for this patient.  Dollene Primrose, MS/CCC- SLP  Leandrew Koyanagi 06/16/2015, 1:59 PM  [place] [facility] MAIN [facility] SERVICES [address] [place], [place], [address] Phone: [phone]   Fax:  [phone]

## 2015-06-18 ENCOUNTER — Ambulatory Visit: Payer: PRIVATE HEALTH INSURANCE | Attending: Neurology | Admitting: Speech Pathology

## 2015-06-18 DIAGNOSIS — R41841 Cognitive communication deficit: Secondary | ICD-10-CM | POA: Insufficient documentation

## 2015-06-19 ENCOUNTER — Encounter: Payer: Self-pay | Admitting: Speech Pathology

## 2015-06-19 NOTE — Therapy (Signed)
[place] MAIN [facility] SERVICES [address] [place], [place], [address] Phone: [phone]   Fax:  [phone]  Speech Language Pathology Treatment/Discharge Summary  Patient Details  Name: Victoria Myers MRN: [id number] Date of Birth: 1956-01-14 Referring Provider:  Anabel Bene, MD  Encounter Date: 06/18/2015      End of Session - 06/19/15 1435    Visit Number 9   Number of Visits 9   Date for SLP Re-Evaluation 06/17/15   SLP Start Time 47   SLP Stop Time  0932   SLP Time Calculation (min) 55 min   Activity Tolerance Patient tolerated treatment well      Past Medical History  Diagnosis Date  . Stroke   . Depression   . Memory loss     Per MD note  . Hypertension   . Pseudodementia     Per MD note    History reviewed. No pertinent past surgical history.  There were no vitals filed for this visit.  Visit Diagnosis: Cognitive communication deficit      Subjective Assessment - 06/19/15 1434    Subjective The patient reports that she is happy with her course of speech therapy.     Currently in Pain? No/denies               ADULT SLP TREATMENT - 06/19/15 0001    General Information   Behavior/Cognition Alert;Cooperative;Pleasant mood;Requires cueing   Treatment Provided   Treatment provided Cognitive-Linquistic   Pain Assessment   Pain Assessment No/denies pain   Cognitive-Linquistic Treatment   Treatment focused on Cognition   Skilled Treatment The patient came to therapy without her daughter, who is providing the cognitive assistance that the patient requires.  The patient has combined her new calendar/organization system with her work system.  The patient reports that she is using the system and there is greater evidence that she is effectively using the system yet.  The patient completed assignments from last session.  The patient reports that she is learning to detect anger and deflect by thinking about her  dog.  She says that she is also reflecting on the cause of her anger and speaking directly to a person regarding her feelings.  The patient is able to tell me the ways that she is "re-engaging" in her life- return to church, visiting with friends, organizing her home, etc.   Assessment / Recommendations / Plan   Plan Discharge SLP treatment due to (comment);Goals updated  Goals met given min assist from family   Progression Toward Goals   Progression toward goals Goals met, education completed, patient discharged from SLP          SLP Education - 06/19/15 1434    Education Details Use of external memory aids, strategies to de-escalate anger/frustration   Person(s) Educated Patient   Methods Explanation   Comprehension Verbalized understanding;Returned demonstration            SLP Long Term Goals - 06/19/15 1436    SLP LONG TERM GOAL #1   Title Patient will demonstrate functional cognitive-communication skills for independent completion of personal responsibilities.   Status Achieved   SLP LONG TERM GOAL #2   Title Patient will complete memory strategy activities with 80% accuracy.   Status Achieved          Plan - 06/19/15 1435    Clinical Impression Statement The patient has successfully adapted her work calendar / organization system to include everything she  needs to remember and schedule.  She continues to require some assistance in consistently and effectively using, but is more accepting of her daughter's help.  The patient has identified strategies to deal with anger and re-engagement in her life.   Speech Therapy Frequency 2x / week   Duration 4 weeks   Treatment/Interventions Internal/external aids;Cognitive reorganization;Functional tasks;SLP instruction and feedback;Compensatory strategies;Patient/family education   Potential to Achieve Goals Good   Potential Considerations Ability to learn/carryover information;Co-morbidities;Cooperation/participation  level;Previous level of function;Severity of impairments;Family/community support   SLP Home Exercise Plan Use system   Consulted and Agree with Plan of Care Patient        Problem List There are no active problems to display for this patient.  Leroy Sea, MS/CCC- SLP  Lou Miner 06/19/2015, 2:39 PM  [place] MAIN [facility] SERVICES [address] [place], [place], [address] Phone: [phone]   Fax:  [phone]

## 2015-09-22 ENCOUNTER — Ambulatory Visit (INDEPENDENT_AMBULATORY_CARE_PROVIDER_SITE_OTHER): Payer: PRIVATE HEALTH INSURANCE | Admitting: Licensed Clinical Social Worker

## 2015-09-22 DIAGNOSIS — F331 Major depressive disorder, recurrent, moderate: Secondary | ICD-10-CM
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
## 2015-10-22 ENCOUNTER — Other Ambulatory Visit: Payer: Self-pay | Admitting: Internal Medicine

## 2015-10-22 DIAGNOSIS — Z1231 Encounter for screening mammogram for malignant neoplasm of breast: Secondary | ICD-10-CM

## 2015-10-29 ENCOUNTER — Ambulatory Visit: Payer: PRIVATE HEALTH INSURANCE | Admitting: Psychiatry

## 2015-11-06 ENCOUNTER — Ambulatory Visit: Payer: PRIVATE HEALTH INSURANCE | Attending: Internal Medicine

## 2016-01-25 ENCOUNTER — Ambulatory Visit
Admission: RE | Admit: 2016-01-25 | Discharge: 2016-01-25 | Disposition: A | Discharge status: Home / Self Care | Payer: BLUE CROSS/BLUE SHIELD | Source: Ambulatory Visit | Attending: Internal Medicine | Admitting: Internal Medicine

## 2016-01-25 DIAGNOSIS — Z1231 Encounter for screening mammogram for malignant neoplasm of breast: Secondary | ICD-10-CM | POA: Diagnosis present

## 2016-07-14 ENCOUNTER — Other Ambulatory Visit: Payer: Self-pay | Admitting: Neurology

## 2016-07-14 DIAGNOSIS — F0150 Vascular dementia without behavioral disturbance: Secondary | ICD-10-CM

## 2016-07-14 DIAGNOSIS — F0280 Dementia in other diseases classified elsewhere without behavioral disturbance: Principal | ICD-10-CM

## 2016-07-14 DIAGNOSIS — G309 Alzheimer's disease, unspecified: Principal | ICD-10-CM

## 2016-07-27 ENCOUNTER — Ambulatory Visit
Admission: RE | Admit: 2016-07-27 | Discharge: 2016-07-27 | Disposition: A | Discharge status: Home / Self Care | Payer: BLUE CROSS/BLUE SHIELD | Source: Ambulatory Visit | Attending: Neurology | Admitting: Neurology

## 2016-07-27 DIAGNOSIS — G309 Alzheimer's disease, unspecified: Secondary | ICD-10-CM | POA: Insufficient documentation

## 2016-07-27 DIAGNOSIS — R9082 White matter disease, unspecified: Secondary | ICD-10-CM | POA: Diagnosis not present

## 2016-07-27 DIAGNOSIS — F0280 Dementia in other diseases classified elsewhere without behavioral disturbance: Secondary | ICD-10-CM | POA: Insufficient documentation

## 2016-07-27 DIAGNOSIS — H3322 Serous retinal detachment, left eye: Secondary | ICD-10-CM | POA: Insufficient documentation

## 2016-07-27 DIAGNOSIS — F0150 Vascular dementia without behavioral disturbance: Secondary | ICD-10-CM | POA: Insufficient documentation

## 2017-06-14 IMAGING — MR MR HEAD W/O CM
8 series · 48 of 48 positions shown · non-contrast
Comparison: MRI of 06/18/2011

CLINICAL DATA: Mixed Alzheimer's and vascular dementia

EXAM:
MRI HEAD WITHOUT CONTRAST
TECHNIQUE: Multiplanar, multiecho pulse sequences of the brain and surrounding
structures were obtained without intravenous contrast.

[Series 2: T1 · sagittal · 5.0mm · 0.45mm/px · 3 of 23 slices shown (1 of 2)]
[im 1/23]
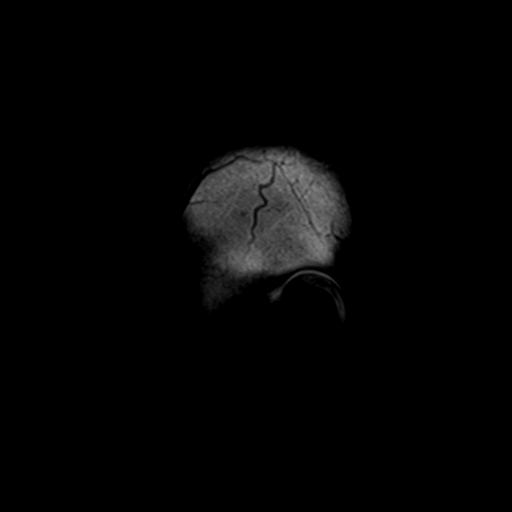
[im 12/23]
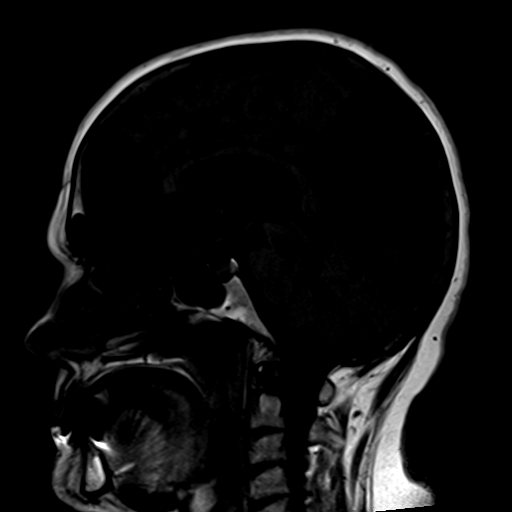
[im 23/23]
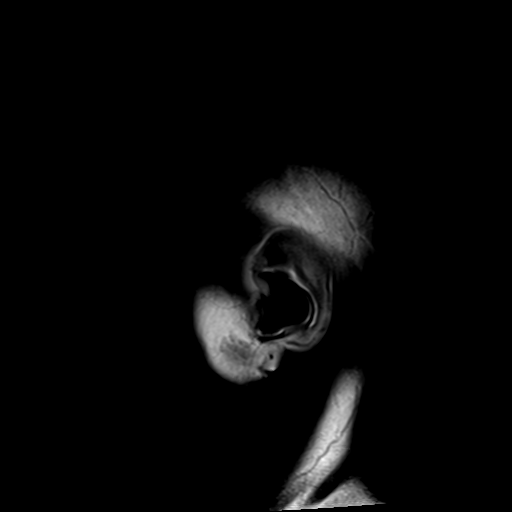

[Series 4: DWI · axial · 3.0mm · 1.20mm/px · z∈[-49,+113]mm · 9 of 55 slices shown (1 of 2)]
[im 1/55]
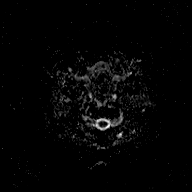
[im 7/55]
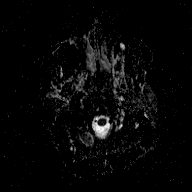
[im 14/55]
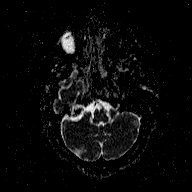
[im 21/55]
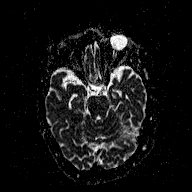
[im 28/55]
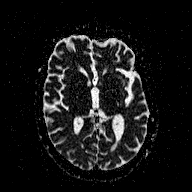
[im 34/55]
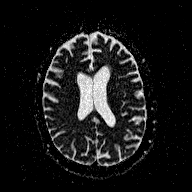
[im 41/55]
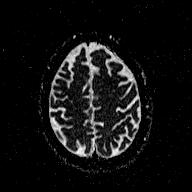
[im 48/55]
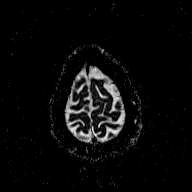
[im 55/55]
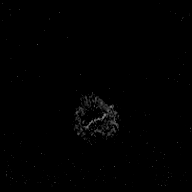

[Series 5: T2 · axial · 5.0mm · 0.72mm/px · z∈[-49,+113]mm · 4 of 26 slices shown (1 of 3)]
[im 1/26]
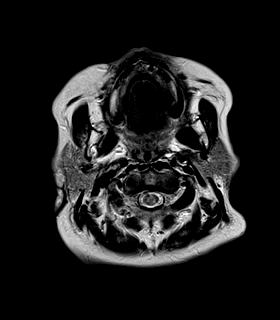
[im 9/26]
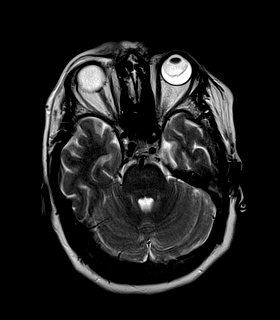
[im 17/26]
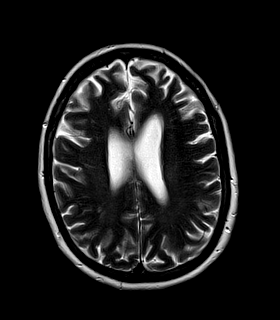
[im 26/26]
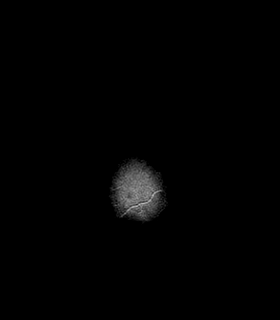

[Series 6: FLAIR · axial · 5.0mm · 0.45mm/px · z∈[-49,+113]mm · 4 of 26 slices shown]
[im 1/26]
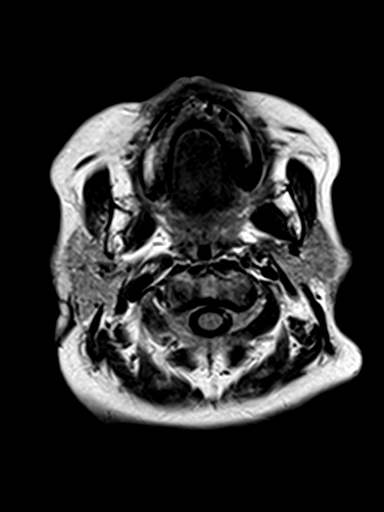
[im 9/26]
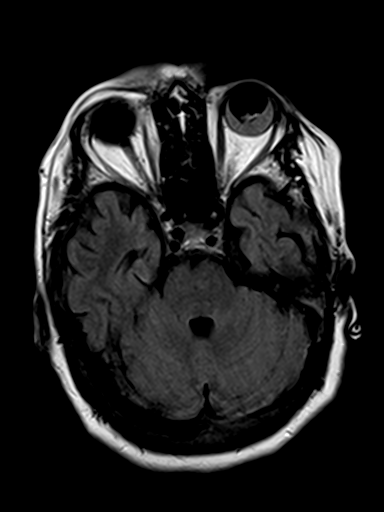
[im 17/26]
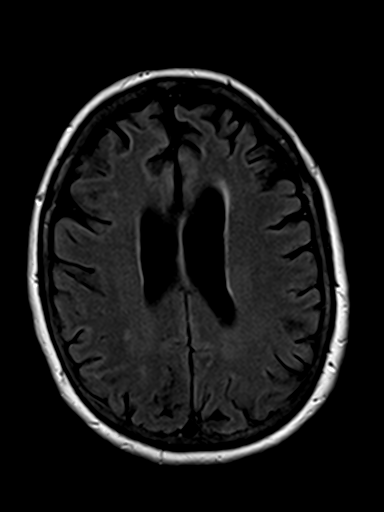
[im 26/26]
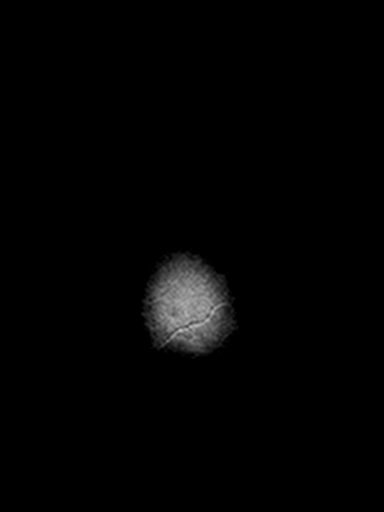

[Series 7: T2 · axial · 5.0mm · 0.72mm/px · z∈[-49,+113]mm · 4 of 26 slices shown (2 of 3)]
[im 1/26]
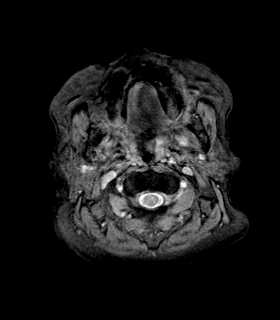
[im 9/26]
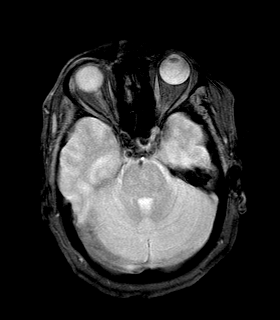
[im 17/26]
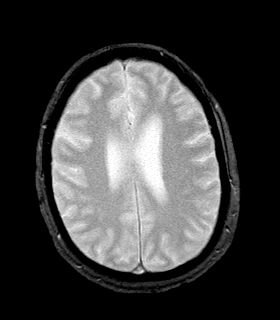
[im 26/26]
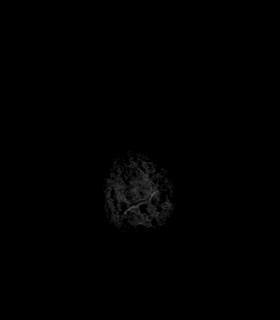

[Series 8: T1 · axial · 3.0mm · 1.00mm/px · z∈[-61,+127]mm · 10 of 64 slices shown (2 of 2)]
[im 1/64]
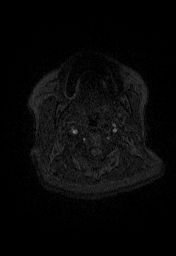
[im 8/64]
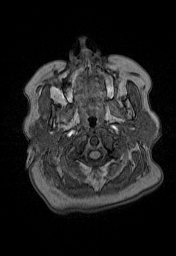
[im 15/64]
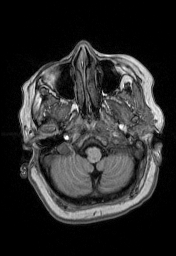
[im 22/64]
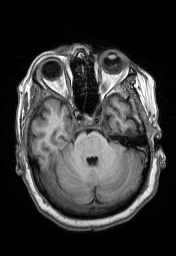
[im 29/64]
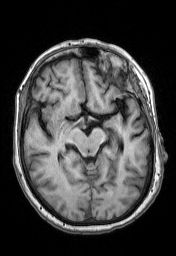
[im 36/64]
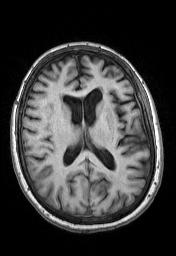
[im 43/64]
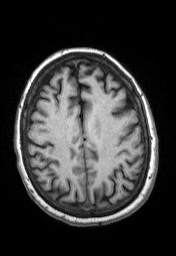
[im 50/64]
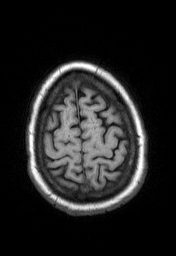
[im 57/64]
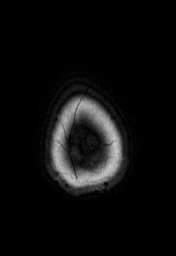
[im 64/64]
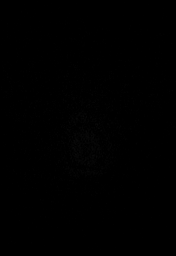

[Series 9: T2 · coronal · 5.0mm · 0.45mm/px · 5 of 29 slices shown (3 of 3)]
[im 1/29]
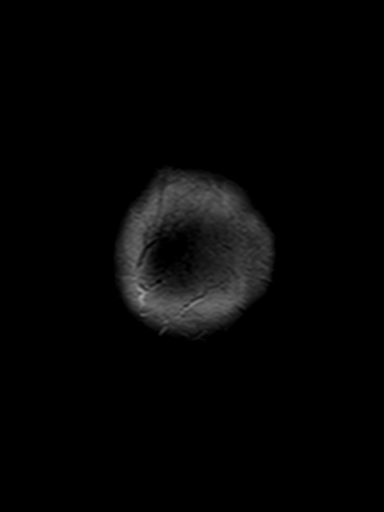
[im 8/29]
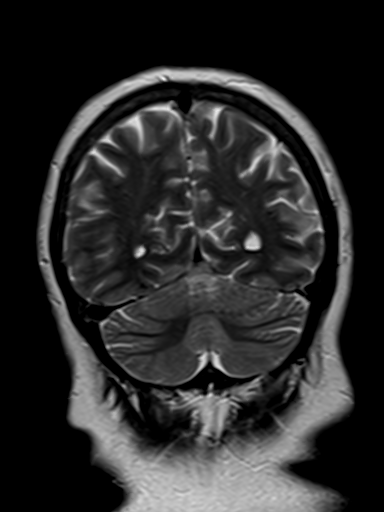
[im 15/29]
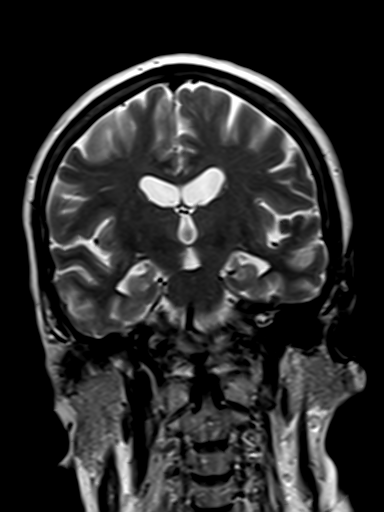
[im 22/29]
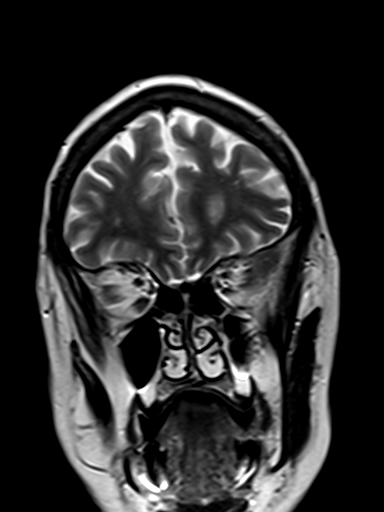
[im 29/29]
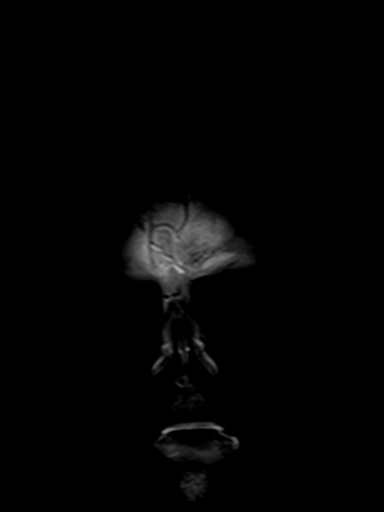

[Series 100: DWI · axial · 3.0mm · 1.20mm/px · z∈[-49,+113]mm · 9 of 55 slices shown (2 of 2)]
[im 1/55]
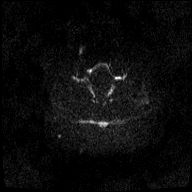
[im 7/55]
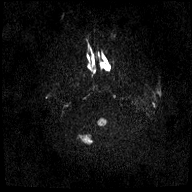
[im 14/55]
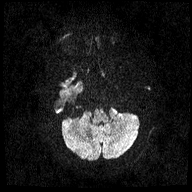
[im 21/55]
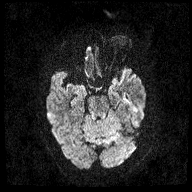
[im 28/55]
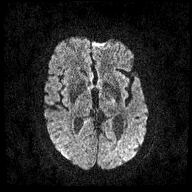
[im 34/55]
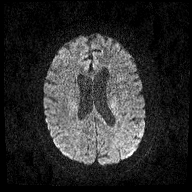
[im 41/55]
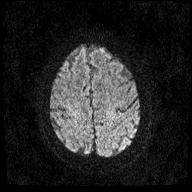
[im 48/55]
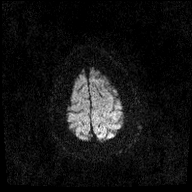
[im 55/55]
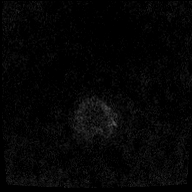

[48 of 48 positions shown; findings below may reference images not displayed]

FINDINGS: Brain: Mild progression of ventricular size due to mild atrophy
which has progressed in the interval. Pituitary not enlarged.

Negative for acute infarct. Several scattered small white matter
hyperintensities similar to the prior study and consistent with mild
microvascular ischemia. Brainstem cerebellum and basal ganglia
normal.

Negative for intracranial hemorrhage. No intracranial mass or edema.
No shift of the midline structures.

Vascular: Normal flow voids.

Skull and upper cervical spine: Negative

Sinuses/Orbits: Retinal detachment on the left has developed since
the prior study. Right globe normal. Mild mucosal edema paranasal
sinuses.

Other: None
IMPRESSION: No acute intracranial abnormality. Mild cerebral atrophy has
progressed since 0930. Minimal changes in the white matter
compatible with microvascular ischemia

Detached retina on the left.

## 2019-04-08 ENCOUNTER — Ambulatory Visit: Payer: Self-pay | Admitting: Urology

## 2019-05-20 ENCOUNTER — Ambulatory Visit: Payer: Self-pay | Admitting: Urology

## 2019-05-21 ENCOUNTER — Encounter: Payer: Self-pay | Admitting: Urology
